# Patient Record
Sex: Male | Born: 1950 | Hispanic: Yes | Marital: Married | Smoking: Never smoker
Health system: Southern US, Community
[De-identification: ages and names within clinical notes are randomized; demographics above are authoritative.]

---

## 2017-07-29 ENCOUNTER — Emergency Department
Admission: EM | Admit: 2017-07-29 | Discharge: 2017-07-29 | Disposition: A | Discharge status: Home / Self Care | Payer: No Typology Code available for payment source | Attending: Emergency Medicine | Admitting: Emergency Medicine

## 2017-07-29 ENCOUNTER — Emergency Department: Payer: No Typology Code available for payment source

## 2017-07-29 ENCOUNTER — Encounter: Payer: Self-pay | Admitting: Emergency Medicine

## 2017-07-29 ENCOUNTER — Other Ambulatory Visit: Payer: Self-pay

## 2017-07-29 DIAGNOSIS — Y999 Unspecified external cause status: Secondary | ICD-10-CM | POA: Diagnosis not present

## 2017-07-29 DIAGNOSIS — Y939 Activity, unspecified: Secondary | ICD-10-CM | POA: Diagnosis not present

## 2017-07-29 DIAGNOSIS — S3992XA Unspecified injury of lower back, initial encounter: Secondary | ICD-10-CM | POA: Diagnosis present

## 2017-07-29 DIAGNOSIS — Y929 Unspecified place or not applicable: Secondary | ICD-10-CM | POA: Insufficient documentation

## 2017-07-29 DIAGNOSIS — S161XXA Strain of muscle, fascia and tendon at neck level, initial encounter: Secondary | ICD-10-CM | POA: Diagnosis not present

## 2017-07-29 DIAGNOSIS — S39012A Strain of muscle, fascia and tendon of lower back, initial encounter: Secondary | ICD-10-CM | POA: Diagnosis not present

## 2017-07-29 MED ORDER — HYDROCODONE-ACETAMINOPHEN 5-325 MG PO TABS
1.0000 | ORAL_TABLET | Freq: Once | ORAL | Status: AC
  Administered 2017-07-29: 1 via ORAL
  Filled 2017-07-29: qty 1

## 2017-07-29 MED ORDER — HYDROCODONE-ACETAMINOPHEN 5-325 MG PO TABS
1.0000 | ORAL_TABLET | Freq: Four times a day (QID) | ORAL | 0 refills | Status: AC | PRN
Start: 2017-07-29 — End: ?
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
## 2017-07-29 NOTE — Discharge Instructions (Signed)
Begin taking Norco 1 every 6 hours as needed for moderate pain.  You may take ibuprofen if needed for soreness.  Moist heat or ice to your muscles as needed for soreness and stiffness.  Return to the emergency department or [facility] if any continued problems.
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
